# Patient Record
Sex: Male | Born: 1965 | Race: White | Hispanic: No | Marital: Single | Smoking: Former smoker
Health system: Southern US, Community
[De-identification: ages and names within clinical notes are randomized; demographics above are authoritative.]

## PROBLEM LIST (undated history)

## (undated) DIAGNOSIS — R9439 Abnormal result of other cardiovascular function study: Secondary | ICD-10-CM

## (undated) DIAGNOSIS — J189 Pneumonia, unspecified organism: Secondary | ICD-10-CM

## (undated) HISTORY — PX: STERNUM: HXRAD1619

---

## 1999-09-19 ENCOUNTER — Encounter: Admission: RE | Admit: 1999-09-19 | Discharge: 1999-09-19 | Payer: Self-pay | Admitting: Gastroenterology

## 1999-09-19 ENCOUNTER — Encounter: Payer: Self-pay | Admitting: Gastroenterology

## 2003-09-24 DIAGNOSIS — J189 Pneumonia, unspecified organism: Secondary | ICD-10-CM

## 2003-09-24 HISTORY — DX: Pneumonia, unspecified organism: J18.9

## 2005-09-05 ENCOUNTER — Ambulatory Visit: Payer: Self-pay | Admitting: Internal Medicine

## 2009-09-23 HISTORY — PX: OTHER SURGICAL HISTORY: SHX169

## 2009-11-27 ENCOUNTER — Ambulatory Visit (HOSPITAL_COMMUNITY): Admission: RE | Admit: 2009-11-27 | Discharge: 2009-11-27 | Payer: Self-pay | Admitting: Orthopedic Surgery

## 2010-12-14 LAB — BASIC METABOLIC PANEL
BUN: 11 mg/dL (ref 6–23)
CO2: 30 mEq/L (ref 19–32)
Calcium: 9.5 mg/dL (ref 8.4–10.5)
Chloride: 105 mEq/L (ref 96–112)
Creatinine, Ser: 1.06 mg/dL (ref 0.4–1.5)
GFR calc Af Amer: >60 mL/min (ref >60)
GFR calc non Af Amer: >60 mL/min (ref >60)
Glucose, Bld: 89 mg/dL (ref 70–99)
Potassium: 4.4 mEq/L (ref 3.5–5.1)
Sodium: 138 mEq/L (ref 135–145)

## 2010-12-14 LAB — DIFFERENTIAL
Basophils Absolute: 0 10*3/uL (ref 0.0–0.1)
Basophils Relative: 0 % (ref 0–1)
Eosinophils Absolute: 0.1 10*3/uL (ref 0.0–0.7)
Eosinophils Relative: 2 % (ref 0–5)
Lymphocytes Relative: 25 % (ref 12–46)
Lymphs Abs: 1.9 10*3/uL (ref 0.7–4.0)
Monocytes Absolute: 0.7 10*3/uL (ref 0.1–1.0)
Monocytes Relative: 9 % (ref 3–12)
Neutro Abs: 4.8 10*3/uL (ref 1.7–7.7)
Neutrophils Relative %: 64 % (ref 43–77)

## 2010-12-14 LAB — CBC
HCT: 44.7 % (ref 39.0–52.0)
Hemoglobin: 15.5 g/dL (ref 13.0–17.0)
MCHC: 34.7 g/dL (ref 30.0–36.0)
MCV: 93 fL (ref 78.0–100.0)
Platelets: 219 10*3/uL (ref 150–400)
RBC: 4.8 MIL/uL (ref 4.22–5.81)
RDW: 12.7 % (ref 11.5–15.5)
WBC: 7.5 10*3/uL (ref 4.0–10.5)

## 2010-12-14 LAB — PROTIME-INR
INR: 1.09 (ref 0.00–1.49)
Prothrombin Time: 14 seconds (ref 11.6–15.2)

## 2010-12-14 LAB — URINALYSIS, ROUTINE W REFLEX MICROSCOPIC
Glucose, UA: NEGATIVE mg/dL
Hgb urine dipstick: NEGATIVE
Ketones, ur: NEGATIVE mg/dL
Protein, ur: NEGATIVE mg/dL
Urobilinogen, UA: 1 mg/dL (ref 0.0–1.0)

## 2011-04-11 IMAGING — RF DG CLAVICLE*L*
1 series · 1 of 1 positions shown · non-contrast
Comparison: None.

CLINICAL DATA: Further reduction internal fixation of left
clavicular fracture

LEFT CLAVICLE - 2+ VIEWS

[Series 1: run · 1 of 1 slices shown]
[im 1/1]
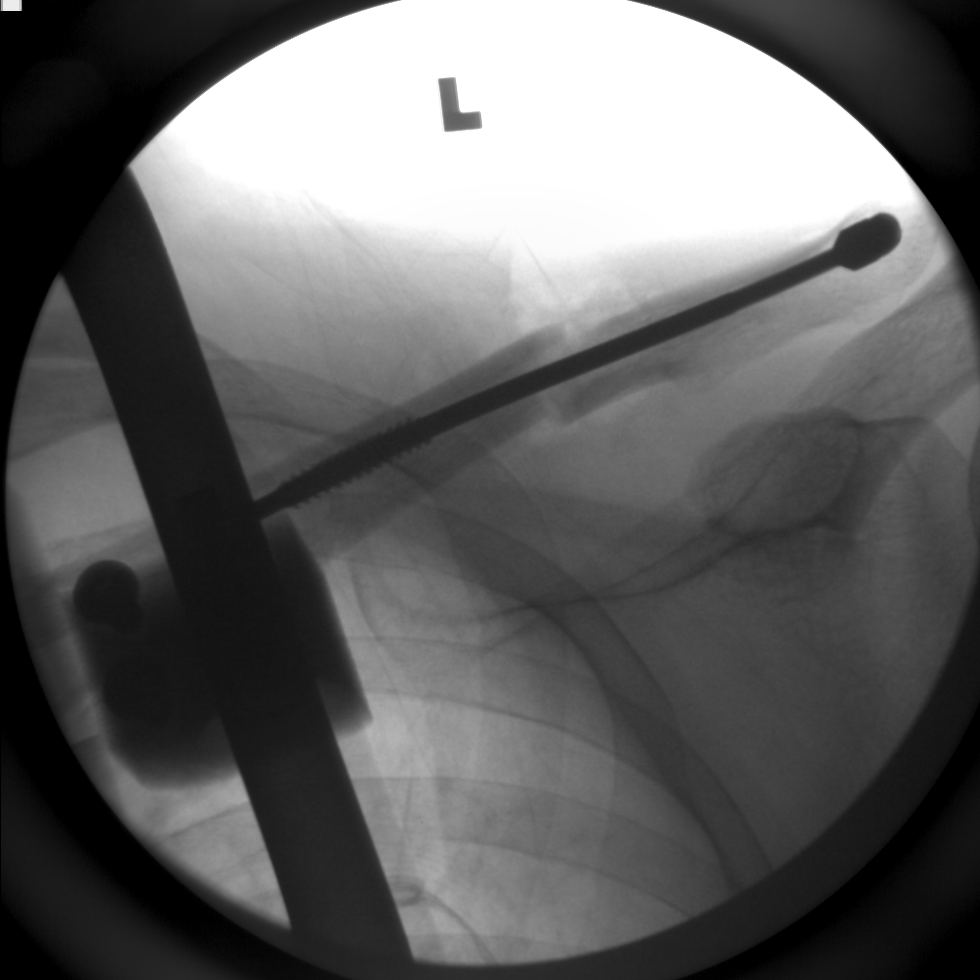

[1 of 1 positions shown; findings below may reference images not displayed]

FINDINGS: A single spot fluoroscopic view of the left clavicle is
submitted.  A metallic rod projects over the left clavicle.  The
midclavicular fracture is visualized.  There  is minimal offset of
fracture fragments.
IMPRESSION: Intraoperative spot fluoroscopic view of the left clavicle
performed during open reduction and internal fixation.

## 2016-08-06 DIAGNOSIS — N486 Induration penis plastica: Secondary | ICD-10-CM | POA: Diagnosis not present

## 2016-08-28 DIAGNOSIS — Z85820 Personal history of malignant melanoma of skin: Secondary | ICD-10-CM | POA: Diagnosis not present

## 2016-08-28 DIAGNOSIS — L905 Scar conditions and fibrosis of skin: Secondary | ICD-10-CM | POA: Diagnosis not present

## 2016-08-28 DIAGNOSIS — D224 Melanocytic nevi of scalp and neck: Secondary | ICD-10-CM | POA: Diagnosis not present

## 2016-08-28 DIAGNOSIS — D225 Melanocytic nevi of trunk: Secondary | ICD-10-CM | POA: Diagnosis not present

## 2016-08-28 DIAGNOSIS — Z08 Encounter for follow-up examination after completed treatment for malignant neoplasm: Secondary | ICD-10-CM | POA: Diagnosis not present

## 2016-08-28 DIAGNOSIS — B078 Other viral warts: Secondary | ICD-10-CM | POA: Diagnosis not present

## 2016-08-29 DIAGNOSIS — N486 Induration penis plastica: Secondary | ICD-10-CM | POA: Diagnosis not present

## 2016-08-29 DIAGNOSIS — N5201 Erectile dysfunction due to arterial insufficiency: Secondary | ICD-10-CM | POA: Diagnosis not present

## 2016-08-29 DIAGNOSIS — A630 Anogenital (venereal) warts: Secondary | ICD-10-CM | POA: Diagnosis not present

## 2016-09-06 DIAGNOSIS — L258 Unspecified contact dermatitis due to other agents: Secondary | ICD-10-CM | POA: Diagnosis not present

## 2016-09-09 ENCOUNTER — Encounter (HOSPITAL_COMMUNITY): Payer: Self-pay

## 2016-09-11 ENCOUNTER — Encounter (HOSPITAL_COMMUNITY): Payer: Self-pay

## 2016-09-11 ENCOUNTER — Other Ambulatory Visit: Payer: Self-pay | Admitting: Gastroenterology

## 2016-09-12 ENCOUNTER — Encounter (HOSPITAL_COMMUNITY)
Admission: RE | Disposition: A | Discharge status: Home / Self Care | Payer: Self-pay | Source: Ambulatory Visit | Attending: Gastroenterology

## 2016-09-12 ENCOUNTER — Ambulatory Visit (HOSPITAL_COMMUNITY)
Admission: RE | Admit: 2016-09-12 | Discharge: 2016-09-12 | Disposition: A | Discharge status: Home / Self Care | Payer: BLUE CROSS/BLUE SHIELD | Source: Ambulatory Visit | Attending: Gastroenterology | Admitting: Gastroenterology

## 2016-09-12 ENCOUNTER — Ambulatory Visit (HOSPITAL_COMMUNITY): Payer: BLUE CROSS/BLUE SHIELD | Admitting: Certified Registered Nurse Anesthetist

## 2016-09-12 ENCOUNTER — Encounter (HOSPITAL_COMMUNITY): Payer: Self-pay | Admitting: Certified Registered Nurse Anesthetist

## 2016-09-12 DIAGNOSIS — Z87891 Personal history of nicotine dependence: Secondary | ICD-10-CM | POA: Diagnosis not present

## 2016-09-12 DIAGNOSIS — Z1211 Encounter for screening for malignant neoplasm of colon: Secondary | ICD-10-CM | POA: Insufficient documentation

## 2016-09-12 DIAGNOSIS — D124 Benign neoplasm of descending colon: Secondary | ICD-10-CM | POA: Diagnosis not present

## 2016-09-12 DIAGNOSIS — Z85038 Personal history of other malignant neoplasm of large intestine: Secondary | ICD-10-CM | POA: Diagnosis not present

## 2016-09-12 DIAGNOSIS — K621 Rectal polyp: Secondary | ICD-10-CM | POA: Diagnosis not present

## 2016-09-12 DIAGNOSIS — K635 Polyp of colon: Secondary | ICD-10-CM | POA: Diagnosis not present

## 2016-09-12 HISTORY — DX: Pneumonia, unspecified organism: J18.9

## 2016-09-12 HISTORY — PX: COLONOSCOPY WITH PROPOFOL: SHX5780

## 2016-09-12 HISTORY — DX: Abnormal result of other cardiovascular function study: R94.39

## 2016-09-12 SURGERY — COLONOSCOPY WITH PROPOFOL
Anesthesia: Monitor Anesthesia Care

## 2016-09-12 MED ORDER — ONDANSETRON HCL 4 MG/2ML IJ SOLN
INTRAMUSCULAR | Status: DC | PRN
  Administered 2016-09-12: 4 mg via INTRAVENOUS

## 2016-09-12 MED ORDER — PROPOFOL 10 MG/ML IV BOLUS
INTRAVENOUS | Status: DC | PRN
  Administered 2016-09-12: 10 mg via INTRAVENOUS
  Administered 2016-09-12: 20 mg via INTRAVENOUS

## 2016-09-12 MED ORDER — LACTATED RINGERS IV SOLN
INTRAVENOUS | Status: DC
  Administered 2016-09-12: 09:00:00 via INTRAVENOUS

## 2016-09-12 MED ORDER — PROPOFOL 500 MG/50ML IV EMUL
INTRAVENOUS | Status: DC | PRN
  Administered 2016-09-12: 200 ug/kg/min via INTRAVENOUS

## 2016-09-12 SURGICAL SUPPLY — 21 items

## 2016-09-12 NOTE — Anesthesia Postprocedure Evaluation (Signed)
Anesthesia Post Note  Patient: Eugene Moore.  Procedure(s) Performed: Procedure(s) (LRB): COLONOSCOPY WITH PROPOFOL (N/A)  Patient location during evaluation: PACU Anesthesia Type: MAC Level of consciousness: awake and alert Pain management: pain level controlled Vital Signs Assessment: post-procedure vital signs reviewed and stable Respiratory status: spontaneous breathing, nonlabored ventilation, respiratory function stable and patient connected to nasal cannula oxygen Cardiovascular status: stable and blood pressure returned to baseline Anesthetic complications: no       Last Vitals:  Vitals:   09/12/16 0833 09/12/16 0953  BP: 139/84 111/70  Pulse:  60  Resp: 20 16  Temp: 36.7 C 36.4 C    Last Pain:  Vitals:   09/12/16 0953  TempSrc: Oral                 Lisbet Busker S

## 2016-09-12 NOTE — Discharge Instructions (Signed)

## 2016-09-12 NOTE — H&P (Signed)
Procedure: Screening colonoscopy. 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 He is scheduled to undergo his first screening colonoscopy with polypectomy to prevent colon cancer.  Past medical history: Left clavicle fracture surgery.  Medication allergies: None  Exam: The patient is alert and lying comfortably on the endoscopy stretcher. Abdomen is soft and nontender to palpation. Lungs are clear to auscultation. Cardiac exam reveals a regular rhythm.  Plan: Proceed with screening colonoscopy

## 2016-09-12 NOTE — Anesthesia Preprocedure Evaluation (Signed)
Anesthesia Evaluation  Patient identified by MRN, date of birth, ID band Patient awake    Reviewed: Allergy & Precautions, NPO status , Patient's Chart, lab work & pertinent test results  Airway Mallampati: II  TM Distance: >3 FB Neck ROM: Full    Dental no notable dental hx.    Pulmonary neg pulmonary ROS, former smoker,    Pulmonary exam normal breath sounds clear to auscultation       Cardiovascular negative cardio ROS Normal cardiovascular exam Rhythm:Regular Rate:Normal     Neuro/Psych negative neurological ROS  negative psych ROS   GI/Hepatic negative GI ROS, Neg liver ROS,   Endo/Other  negative endocrine ROS  Renal/GU negative Renal ROS  negative genitourinary   Musculoskeletal negative musculoskeletal ROS (+)   Abdominal   Peds negative pediatric ROS (+)  Hematology negative hematology ROS (+)   Anesthesia Other Findings   Reproductive/Obstetrics negative OB ROS                             Anesthesia Physical Anesthesia Plan  ASA: I  Anesthesia Plan: MAC   Post-op Pain Management:    Induction: Intravenous  Airway Management Planned: Nasal Cannula  Additional Equipment:   Intra-op Plan:   Post-operative Plan:   Informed Consent: I have reviewed the patients History and Physical, chart, labs and discussed the procedure including the risks, benefits and alternatives for the proposed anesthesia with the patient or authorized representative who has indicated his/her understanding and acceptance.   Dental advisory given  Plan Discussed with: CRNA and Surgeon  Anesthesia Plan Comments:         Anesthesia Quick Evaluation  

## 2016-09-12 NOTE — Transfer of Care (Signed)
Immediate Anesthesia Transfer of Care Note  Patient: Eugene Moore.  Procedure(s) Performed: Procedure(s): COLONOSCOPY WITH PROPOFOL (N/A)  Patient Location: PACU  Anesthesia Type:MAC  Level of Consciousness:  sedated, patient cooperative and responds to stimulation  Airway & Oxygen Therapy:Patient Spontanous Breathing and Patient connected to face mask oxgen  Post-op Assessment:  Report given to PACU RN and Post -op Vital signs reviewed and stable  Post vital signs:  Reviewed and stable  Last Vitals:  Vitals:   09/12/16 0833  BP: 139/84  Resp: 20  Temp: 123XX123 C    Complications: No apparent anesthesia complications

## 2016-09-12 NOTE — Op Note (Signed)
[facility] Patient Name: Eugene Moore Procedure Date: 09/12/2016 MRN: [id number] Attending MD: Garlan Fair , MD Date of Birth: December 09, 1965 CSN: [id number] Age: 50 Admit Type: Outpatient Procedure:                Colonoscopy Indications:              Screening for colorectal malignant neoplasm. Father                            underwent colonoscopy at age 46 with removal of two                            tubulovillous adenomatous colon polyps Providers:                Garlan Fair, MD, Laverta Baltimore RN, RN,                            Ralene Bathe, Technician, Virgia Land, CRNA Referring MD:              Medicines:                Propofol per Anesthesia Complications:            No immediate complications. Estimated Blood Loss:     Estimated blood loss was minimal. Procedure:                Pre-Anesthesia Assessment:                           - Prior to the procedure, a History and Physical                            was performed, and patient medications and                            allergies were reviewed. The patient's tolerance of                            previous anesthesia was also reviewed. The risks                            and benefits of the procedure and the sedation                            options and risks were discussed with the patient.                            All questions were answered, and informed consent                            was obtained. Prior Anticoagulants: The patient has                            taken aspirin, last dose was 1 day prior to  procedure. ASA Grade Assessment: I - A normal,                            healthy patient. After reviewing the risks and                            benefits, the patient was deemed in satisfactory                            condition to undergo the procedure.                           After obtaining informed consent, the colonoscope            was passed under direct vision. Throughout the                            procedure, the patient's blood pressure, pulse, and                            oxygen saturations were monitored continuously. The                            EC-[id number]) scope was introduced through                            the anus and advanced to the the cecum, identified                            by appendiceal orifice and ileocecal valve. The                            colonoscopy was performed without difficulty. The                            patient tolerated the procedure well. The quality                            of the bowel preparation was good. The terminal                            ileum, the ileocecal valve, the appendiceal orifice                            and the rectum were photographed. Findings:      The perianal and digital rectal examinations were normal.      Two sessile polyps were found in the rectum. The polyps were 3 mm in       size. These polyps were removed with a cold biopsy forceps. Resection       and retrieval were complete.      A 3 mm polyp was found in the proximal descending colon. The polyp was       sessile. The polyp was removed with a cold biopsy forceps. Resection and       retrieval  were complete.      Two sessile polyps were found in the proximal descending colon. The       polyps were 5 mm in size. These polyps were removed with a cold snare.       Resection and retrieval were complete. Impression:               - Two 3 mm polyps in the rectum, removed with a                            cold biopsy forceps. Resected and retrieved.                           - One 3 mm polyp in the proximal descending colon,                            removed with a cold biopsy forceps. Resected and                            retrieved.                           - Two 5 mm polyps in the proximal descending colon,                            removed with a cold snare.  Resected and retrieved. Moderate Sedation:      N/A- Per Anesthesia Care Recommendation:           - Patient has a contact number available for                            emergencies. The signs and symptoms of potential                            delayed complications were discussed with the                            patient. Return to normal activities tomorrow.                            Written discharge instructions were provided to the                            patient.                           - Repeat colonoscopy date to be determined after                            pending pathology results are reviewed for                            screening purposes.                           - Resume previous diet.                           -  Continue present medications. Procedure Code(s):        --- Professional ---                           [phone], Colonoscopy, flexible; with removal of                            tumor(s), polyp(s), or other lesion(s) by snare                            technique                           45380, 1, Colonoscopy, flexible; with biopsy,                            single or multiple Diagnosis Code(s):        --- Professional ---                           Z12.11, Encounter for screening for malignant                            neoplasm of colon                           K62.1, Rectal polyp                           D12.4, Benign neoplasm of descending colon CPT copyright 2016 [employer]. All rights reserved. The codes documented in this report are preliminary and upon coder review may  be revised to meet current compliance requirements. Earle Gell, MD Garlan Fair, MD 09/12/2016 9:59:13 AM This report has been signed electronically. Number of Addenda: 0

## 2016-09-13 ENCOUNTER — Encounter (HOSPITAL_COMMUNITY): Payer: Self-pay | Admitting: Gastroenterology

## 2017-01-08 DIAGNOSIS — N486 Induration penis plastica: Secondary | ICD-10-CM | POA: Diagnosis not present

## 2017-01-08 DIAGNOSIS — N5201 Erectile dysfunction due to arterial insufficiency: Secondary | ICD-10-CM | POA: Diagnosis not present

## 2017-05-21 DIAGNOSIS — Z125 Encounter for screening for malignant neoplasm of prostate: Secondary | ICD-10-CM | POA: Diagnosis not present

## 2017-05-21 DIAGNOSIS — N5201 Erectile dysfunction due to arterial insufficiency: Secondary | ICD-10-CM | POA: Diagnosis not present

## 2017-05-21 DIAGNOSIS — N486 Induration penis plastica: Secondary | ICD-10-CM | POA: Diagnosis not present

## 2017-06-06 DIAGNOSIS — L905 Scar conditions and fibrosis of skin: Secondary | ICD-10-CM | POA: Diagnosis not present

## 2017-06-06 DIAGNOSIS — L648 Other androgenic alopecia: Secondary | ICD-10-CM | POA: Diagnosis not present

## 2017-07-08 DIAGNOSIS — M62838 Other muscle spasm: Secondary | ICD-10-CM | POA: Diagnosis not present

## 2017-07-08 DIAGNOSIS — N486 Induration penis plastica: Secondary | ICD-10-CM | POA: Diagnosis not present

## 2017-07-08 DIAGNOSIS — N5201 Erectile dysfunction due to arterial insufficiency: Secondary | ICD-10-CM | POA: Diagnosis not present

## 2017-07-08 DIAGNOSIS — M6289 Other specified disorders of muscle: Secondary | ICD-10-CM | POA: Diagnosis not present

## 2017-07-15 DIAGNOSIS — N486 Induration penis plastica: Secondary | ICD-10-CM | POA: Diagnosis not present

## 2017-07-16 DIAGNOSIS — M6289 Other specified disorders of muscle: Secondary | ICD-10-CM | POA: Diagnosis not present

## 2017-07-16 DIAGNOSIS — N5201 Erectile dysfunction due to arterial insufficiency: Secondary | ICD-10-CM | POA: Diagnosis not present

## 2017-07-16 DIAGNOSIS — M62838 Other muscle spasm: Secondary | ICD-10-CM | POA: Diagnosis not present

## 2017-07-16 DIAGNOSIS — R1032 Left lower quadrant pain: Secondary | ICD-10-CM | POA: Diagnosis not present

## 2017-07-17 DIAGNOSIS — N486 Induration penis plastica: Secondary | ICD-10-CM | POA: Diagnosis not present

## 2017-11-14 DIAGNOSIS — Z23 Encounter for immunization: Secondary | ICD-10-CM | POA: Diagnosis not present

## 2017-11-14 DIAGNOSIS — R82998 Other abnormal findings in urine: Secondary | ICD-10-CM | POA: Diagnosis not present

## 2017-11-14 DIAGNOSIS — Z113 Encounter for screening for infections with a predominantly sexual mode of transmission: Secondary | ICD-10-CM | POA: Diagnosis not present

## 2017-11-14 DIAGNOSIS — Z0000 Encounter for general adult medical examination without abnormal findings: Secondary | ICD-10-CM | POA: Diagnosis not present

## 2017-11-14 DIAGNOSIS — Z1389 Encounter for screening for other disorder: Secondary | ICD-10-CM | POA: Diagnosis not present

## 2017-11-14 DIAGNOSIS — Z125 Encounter for screening for malignant neoplasm of prostate: Secondary | ICD-10-CM | POA: Diagnosis not present

## 2017-11-25 DIAGNOSIS — B0089 Other herpesviral infection: Secondary | ICD-10-CM | POA: Diagnosis not present

## 2018-10-05 DIAGNOSIS — D235 Other benign neoplasm of skin of trunk: Secondary | ICD-10-CM | POA: Diagnosis not present

## 2018-10-05 DIAGNOSIS — B078 Other viral warts: Secondary | ICD-10-CM | POA: Diagnosis not present

## 2019-01-25 DIAGNOSIS — Z0000 Encounter for general adult medical examination without abnormal findings: Secondary | ICD-10-CM | POA: Diagnosis not present

## 2019-01-25 DIAGNOSIS — Z125 Encounter for screening for malignant neoplasm of prostate: Secondary | ICD-10-CM | POA: Diagnosis not present

## 2019-02-02 DIAGNOSIS — Z1331 Encounter for screening for depression: Secondary | ICD-10-CM | POA: Diagnosis not present

## 2019-02-02 DIAGNOSIS — Z0000 Encounter for general adult medical examination without abnormal findings: Secondary | ICD-10-CM | POA: Diagnosis not present

## 2020-01-31 DIAGNOSIS — Z125 Encounter for screening for malignant neoplasm of prostate: Secondary | ICD-10-CM | POA: Diagnosis not present

## 2020-01-31 DIAGNOSIS — Z0000 Encounter for general adult medical examination without abnormal findings: Secondary | ICD-10-CM | POA: Diagnosis not present

## 2020-01-31 DIAGNOSIS — R7989 Other specified abnormal findings of blood chemistry: Secondary | ICD-10-CM | POA: Diagnosis not present

## 2020-02-04 DIAGNOSIS — Z1331 Encounter for screening for depression: Secondary | ICD-10-CM | POA: Diagnosis not present

## 2020-02-04 DIAGNOSIS — H00019 Hordeolum externum unspecified eye, unspecified eyelid: Secondary | ICD-10-CM | POA: Diagnosis not present

## 2020-02-04 DIAGNOSIS — R030 Elevated blood-pressure reading, without diagnosis of hypertension: Secondary | ICD-10-CM | POA: Diagnosis not present

## 2020-02-04 DIAGNOSIS — B009 Herpesviral infection, unspecified: Secondary | ICD-10-CM | POA: Diagnosis not present

## 2020-02-04 DIAGNOSIS — N486 Induration penis plastica: Secondary | ICD-10-CM | POA: Diagnosis not present

## 2020-02-04 DIAGNOSIS — Z0000 Encounter for general adult medical examination without abnormal findings: Secondary | ICD-10-CM | POA: Diagnosis not present

## 2020-06-01 DIAGNOSIS — N5201 Erectile dysfunction due to arterial insufficiency: Secondary | ICD-10-CM | POA: Diagnosis not present

## 2020-06-01 DIAGNOSIS — N486 Induration penis plastica: Secondary | ICD-10-CM | POA: Diagnosis not present

## 2021-05-31 DIAGNOSIS — Z125 Encounter for screening for malignant neoplasm of prostate: Secondary | ICD-10-CM | POA: Diagnosis not present

## 2021-05-31 DIAGNOSIS — R030 Elevated blood-pressure reading, without diagnosis of hypertension: Secondary | ICD-10-CM | POA: Diagnosis not present

## 2021-09-20 DIAGNOSIS — Z1331 Encounter for screening for depression: Secondary | ICD-10-CM | POA: Diagnosis not present

## 2021-09-20 DIAGNOSIS — Z1339 Encounter for screening examination for other mental health and behavioral disorders: Secondary | ICD-10-CM | POA: Diagnosis not present

## 2021-09-20 DIAGNOSIS — R030 Elevated blood-pressure reading, without diagnosis of hypertension: Secondary | ICD-10-CM | POA: Diagnosis not present

## 2021-09-20 DIAGNOSIS — Z0000 Encounter for general adult medical examination without abnormal findings: Secondary | ICD-10-CM | POA: Diagnosis not present

## 2021-09-25 DIAGNOSIS — L738 Other specified follicular disorders: Secondary | ICD-10-CM | POA: Diagnosis not present

## 2021-09-25 DIAGNOSIS — L7211 Pilar cyst: Secondary | ICD-10-CM | POA: Diagnosis not present

## 2021-09-25 DIAGNOSIS — L821 Other seborrheic keratosis: Secondary | ICD-10-CM | POA: Diagnosis not present

## 2022-10-01 DIAGNOSIS — N5201 Erectile dysfunction due to arterial insufficiency: Secondary | ICD-10-CM | POA: Diagnosis not present

## 2022-11-15 DIAGNOSIS — Z1283 Encounter for screening for malignant neoplasm of skin: Secondary | ICD-10-CM | POA: Diagnosis not present

## 2022-11-15 DIAGNOSIS — D224 Melanocytic nevi of scalp and neck: Secondary | ICD-10-CM | POA: Diagnosis not present

## 2022-11-15 DIAGNOSIS — L7211 Pilar cyst: Secondary | ICD-10-CM | POA: Diagnosis not present

## 2022-11-19 DIAGNOSIS — L7211 Pilar cyst: Secondary | ICD-10-CM | POA: Diagnosis not present

## 2022-11-29 DIAGNOSIS — M7712 Lateral epicondylitis, left elbow: Secondary | ICD-10-CM | POA: Diagnosis not present

## 2022-11-30 ENCOUNTER — Other Ambulatory Visit: Payer: Self-pay

## 2022-11-30 ENCOUNTER — Emergency Department (HOSPITAL_COMMUNITY)
Admission: EM | Admit: 2022-11-30 | Discharge: 2022-11-30 | Disposition: A | Discharge status: Home / Self Care | Payer: BC Managed Care – PPO | Attending: Emergency Medicine | Admitting: Emergency Medicine

## 2022-11-30 ENCOUNTER — Emergency Department (HOSPITAL_COMMUNITY): Payer: BC Managed Care – PPO

## 2022-11-30 DIAGNOSIS — M25519 Pain in unspecified shoulder: Secondary | ICD-10-CM | POA: Diagnosis not present

## 2022-11-30 DIAGNOSIS — Y92410 Unspecified street and highway as the place of occurrence of the external cause: Secondary | ICD-10-CM | POA: Insufficient documentation

## 2022-11-30 DIAGNOSIS — M25512 Pain in left shoulder: Secondary | ICD-10-CM | POA: Diagnosis not present

## 2022-11-30 DIAGNOSIS — S299XXA Unspecified injury of thorax, initial encounter: Secondary | ICD-10-CM | POA: Diagnosis not present

## 2022-11-30 DIAGNOSIS — M542 Cervicalgia: Secondary | ICD-10-CM | POA: Insufficient documentation

## 2022-11-30 DIAGNOSIS — Z041 Encounter for examination and observation following transport accident: Secondary | ICD-10-CM | POA: Diagnosis not present

## 2022-11-30 DIAGNOSIS — M545 Low back pain, unspecified: Secondary | ICD-10-CM | POA: Diagnosis not present

## 2022-11-30 DIAGNOSIS — Z7982 Long term (current) use of aspirin: Secondary | ICD-10-CM | POA: Diagnosis not present

## 2022-11-30 DIAGNOSIS — M549 Dorsalgia, unspecified: Secondary | ICD-10-CM | POA: Diagnosis not present

## 2022-11-30 DIAGNOSIS — R519 Headache, unspecified: Secondary | ICD-10-CM | POA: Diagnosis not present

## 2022-11-30 DIAGNOSIS — S199XXA Unspecified injury of neck, initial encounter: Secondary | ICD-10-CM | POA: Diagnosis not present

## 2022-11-30 MED ORDER — IBUPROFEN 400 MG PO TABS
600.0000 mg | ORAL_TABLET | Freq: Once | ORAL | Status: AC
  Administered 2022-11-30: 600 mg via ORAL
  Filled 2022-11-30: qty 1

## 2022-11-30 MED ORDER — ACETAMINOPHEN 325 MG PO TABS
650.0000 mg | ORAL_TABLET | Freq: Once | ORAL | Status: AC
  Administered 2022-11-30: 650 mg via ORAL
  Filled 2022-11-30: qty 2
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
## 2022-11-30 NOTE — ED Notes (Signed)
Patient transported to X-ray 
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
## 2023-02-24 DIAGNOSIS — M25512 Pain in left shoulder: Secondary | ICD-10-CM | POA: Diagnosis not present

## 2023-03-18 DIAGNOSIS — H9313 Tinnitus, bilateral: Secondary | ICD-10-CM | POA: Diagnosis not present

## 2023-03-20 DIAGNOSIS — M25612 Stiffness of left shoulder, not elsewhere classified: Secondary | ICD-10-CM | POA: Diagnosis not present

## 2023-03-20 DIAGNOSIS — M7542 Impingement syndrome of left shoulder: Secondary | ICD-10-CM | POA: Diagnosis not present

## 2023-03-21 DIAGNOSIS — H5213 Myopia, bilateral: Secondary | ICD-10-CM | POA: Diagnosis not present

## 2023-03-21 DIAGNOSIS — G43909 Migraine, unspecified, not intractable, without status migrainosus: Secondary | ICD-10-CM | POA: Diagnosis not present

## 2023-03-25 DIAGNOSIS — M7542 Impingement syndrome of left shoulder: Secondary | ICD-10-CM | POA: Diagnosis not present

## 2023-03-25 DIAGNOSIS — M25612 Stiffness of left shoulder, not elsewhere classified: Secondary | ICD-10-CM | POA: Diagnosis not present

## 2023-03-31 DIAGNOSIS — M25512 Pain in left shoulder: Secondary | ICD-10-CM | POA: Diagnosis not present

## 2023-04-03 DIAGNOSIS — M7542 Impingement syndrome of left shoulder: Secondary | ICD-10-CM | POA: Diagnosis not present

## 2023-04-03 DIAGNOSIS — M25612 Stiffness of left shoulder, not elsewhere classified: Secondary | ICD-10-CM | POA: Diagnosis not present

## 2023-04-07 DIAGNOSIS — M25512 Pain in left shoulder: Secondary | ICD-10-CM | POA: Diagnosis not present

## 2023-04-08 DIAGNOSIS — H9313 Tinnitus, bilateral: Secondary | ICD-10-CM | POA: Diagnosis not present

## 2023-04-09 DIAGNOSIS — M25612 Stiffness of left shoulder, not elsewhere classified: Secondary | ICD-10-CM | POA: Diagnosis not present

## 2023-04-09 DIAGNOSIS — M7542 Impingement syndrome of left shoulder: Secondary | ICD-10-CM | POA: Diagnosis not present

## 2023-04-10 DIAGNOSIS — M25612 Stiffness of left shoulder, not elsewhere classified: Secondary | ICD-10-CM | POA: Diagnosis not present

## 2023-04-10 DIAGNOSIS — M7542 Impingement syndrome of left shoulder: Secondary | ICD-10-CM | POA: Diagnosis not present

## 2023-04-14 DIAGNOSIS — M7542 Impingement syndrome of left shoulder: Secondary | ICD-10-CM | POA: Diagnosis not present

## 2023-04-14 DIAGNOSIS — M25612 Stiffness of left shoulder, not elsewhere classified: Secondary | ICD-10-CM | POA: Diagnosis not present

## 2023-04-17 DIAGNOSIS — M7542 Impingement syndrome of left shoulder: Secondary | ICD-10-CM | POA: Diagnosis not present

## 2023-04-17 DIAGNOSIS — M25612 Stiffness of left shoulder, not elsewhere classified: Secondary | ICD-10-CM | POA: Diagnosis not present

## 2023-04-21 DIAGNOSIS — M25612 Stiffness of left shoulder, not elsewhere classified: Secondary | ICD-10-CM | POA: Diagnosis not present

## 2023-04-22 DIAGNOSIS — M7542 Impingement syndrome of left shoulder: Secondary | ICD-10-CM | POA: Diagnosis not present

## 2023-04-22 DIAGNOSIS — M25612 Stiffness of left shoulder, not elsewhere classified: Secondary | ICD-10-CM | POA: Diagnosis not present

## 2023-04-23 DIAGNOSIS — M25612 Stiffness of left shoulder, not elsewhere classified: Secondary | ICD-10-CM | POA: Diagnosis not present

## 2023-04-24 DIAGNOSIS — M25612 Stiffness of left shoulder, not elsewhere classified: Secondary | ICD-10-CM | POA: Diagnosis not present

## 2023-04-24 DIAGNOSIS — M7542 Impingement syndrome of left shoulder: Secondary | ICD-10-CM | POA: Diagnosis not present

## 2023-07-28 DIAGNOSIS — I4819 Other persistent atrial fibrillation: Secondary | ICD-10-CM | POA: Diagnosis not present

## 2023-07-28 DIAGNOSIS — R4702 Dysphasia: Secondary | ICD-10-CM | POA: Diagnosis not present

## 2023-07-29 DIAGNOSIS — E876 Hypokalemia: Secondary | ICD-10-CM | POA: Diagnosis not present

## 2023-07-29 DIAGNOSIS — J189 Pneumonia, unspecified organism: Secondary | ICD-10-CM | POA: Diagnosis not present

## 2024-02-05 DIAGNOSIS — Z125 Encounter for screening for malignant neoplasm of prostate: Secondary | ICD-10-CM | POA: Diagnosis not present

## 2024-02-05 DIAGNOSIS — Z0000 Encounter for general adult medical examination without abnormal findings: Secondary | ICD-10-CM | POA: Diagnosis not present

## 2024-02-12 DIAGNOSIS — Z0000 Encounter for general adult medical examination without abnormal findings: Secondary | ICD-10-CM | POA: Diagnosis not present

## 2024-02-12 DIAGNOSIS — Z1339 Encounter for screening examination for other mental health and behavioral disorders: Secondary | ICD-10-CM | POA: Diagnosis not present

## 2024-02-12 DIAGNOSIS — Z1331 Encounter for screening for depression: Secondary | ICD-10-CM | POA: Diagnosis not present

## 2024-02-12 DIAGNOSIS — Z23 Encounter for immunization: Secondary | ICD-10-CM | POA: Diagnosis not present

## 2024-02-12 DIAGNOSIS — G43109 Migraine with aura, not intractable, without status migrainosus: Secondary | ICD-10-CM | POA: Diagnosis not present

## 2024-05-03 DIAGNOSIS — N5201 Erectile dysfunction due to arterial insufficiency: Secondary | ICD-10-CM | POA: Diagnosis not present

## 2024-08-13 DIAGNOSIS — L820 Inflamed seborrheic keratosis: Secondary | ICD-10-CM | POA: Diagnosis not present
